# Patient Record
Sex: Female | Born: 1980 | Race: Black or African American | Hispanic: No | Marital: Single | Smoking: Never smoker
Health system: Southern US, Community
[De-identification: ages and names within clinical notes are randomized; demographics above are authoritative.]

## PROBLEM LIST (undated history)

## (undated) DIAGNOSIS — G43909 Migraine, unspecified, not intractable, without status migrainosus: Secondary | ICD-10-CM

---

## 2005-01-28 ENCOUNTER — Other Ambulatory Visit: Admission: RE | Admit: 2005-01-28 | Discharge: 2005-01-28 | Payer: Self-pay | Admitting: Gynecology

## 2006-02-03 ENCOUNTER — Other Ambulatory Visit: Admission: RE | Admit: 2006-02-03 | Discharge: 2006-02-03 | Payer: Self-pay | Admitting: Gynecology

## 2008-02-01 ENCOUNTER — Inpatient Hospital Stay (HOSPITAL_COMMUNITY): Admission: RE | Admit: 2008-02-01 | Discharge: 2008-02-03 | Payer: Self-pay | Admitting: Obstetrics and Gynecology

## 2008-07-14 ENCOUNTER — Emergency Department (HOSPITAL_COMMUNITY): Admission: EM | Admit: 2008-07-14 | Discharge: 2008-07-14 | Payer: Self-pay | Admitting: Family Medicine

## 2009-05-28 ENCOUNTER — Inpatient Hospital Stay (HOSPITAL_COMMUNITY): Admission: AD | Admit: 2009-05-28 | Discharge: 2009-05-28 | Payer: Self-pay | Admitting: Obstetrics and Gynecology

## 2009-12-17 ENCOUNTER — Inpatient Hospital Stay (HOSPITAL_COMMUNITY): Admission: RE | Admit: 2009-12-17 | Discharge: 2009-12-19 | Payer: Self-pay | Admitting: Obstetrics and Gynecology
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
## 2010-07-26 LAB — CBC
HCT: 26.3 % — ABNORMAL LOW (ref 36.0–46.0)
HCT: 31.5 % — ABNORMAL LOW (ref 36.0–46.0)
Hemoglobin: 10.3 g/dL — ABNORMAL LOW (ref 12.0–15.0)
Hemoglobin: 8.6 g/dL — ABNORMAL LOW (ref 12.0–15.0)
MCHC: 32.8 g/dL (ref 30.0–36.0)
MCV: 80.9 fL (ref 78.0–100.0)
RBC: 3.29 MIL/uL — ABNORMAL LOW (ref 3.87–5.11)
RDW: 14.1 % (ref 11.5–15.5)
WBC: 9.6 10*3/uL (ref 4.0–10.5)

## 2010-07-26 LAB — CCBB MATERNAL DONOR DRAW

## 2010-07-26 LAB — SURGICAL PCR SCREEN
MRSA, PCR: NEGATIVE
Staphylococcus aureus: POSITIVE — AB

## 2010-09-24 NOTE — H&P (Signed)
NAME:  JAMES, LAFALCE NO.:  [id number]   MEDICAL RECORD NO.:  [id number]          PATIENT TYPE:  INP   LOCATION:  NA                            FACILITY:  [facility]   PHYSICIAN:  Huel Cote, M.D. DATE OF BIRTH:  [birth year]   DATE OF ADMISSION:  DATE OF DISCHARGE:                              HISTORY & PHYSICAL   PLAN:  She is scheduled for surgery at the [facility] at  7:30 a.m. on February 01, 2008.   HISTORY:  The patient is a 30 year old G2, P 0-0-1-0 who is coming in at  39+ weeks' gestation for a scheduled primary low transverse C. section  due to a finding of a breech presentation.  The patient declined any  trial aversion and elected to undergo cesarean section after the risks  and benefits were discussed with her in detail.  Her prenatal care was  otherwise uneventful.   LABORATORY DATA:  Prenatal labs are as follows:  O+ antibody negative,  RPR nonreactive, sickle normal, rubella immune, hepatitis B surface  antigen negative, HIV negative, GC negative, chlamydia negative.  First  trimester screen normal and group B strep negative.   PAST OBSTETRICAL HISTORY:  Significant for elective abortion in 2004.   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  Most recent Pap  smear in March 2009 was normal.   PAST MEDICAL HISTORY:  None except migraines.   PAST SURGICAL HISTORY:  None.   ALLERGIES:  NONE.   CURRENT MEDICATIONS:  Include prenatal vitamins only.   PHYSICAL EXAMINATION:  VITAL SIGNS:  The patient's weight is 226 pounds,  blood pressure is 110/60.  CARDIAC:  Regular rate and rhythm.  LUNGS:  Clear.  ABDOMEN:  Soft, gravid and nontender.  GU:  Cervix is 50 and fingertip.   DIAGNOSTICS:  Ultrasound confirmed breech presentation.   ASSESSMENT:  The patient was counseled of the risks and benefits of  surgery including bleeding, infection and possible damage to bowel and  bladder.  She was also given the option of a  trial aversion.  However,  decided against this and would like to proceed with cesarean section at  this time.  After careful consideration, she was scheduled for cesarean  section on February 01, 2008 at 7:30 a.m. and will have her preop  appointment there prior to that.      Huel Cote, M.D.  Electronically Signed     KR/MEDQ  D:  01/31/2008  T:  01/31/2008  Job:  [id number]

## 2010-09-24 NOTE — Op Note (Signed)
Sarah Holden, Sarah Holden                ACCOUNT NO.:  [id number]   MEDICAL RECORD NO.:  [id number]          PATIENT TYPE:  INP   LOCATION:  9133                          FACILITY:  [facility]   PHYSICIAN:  Huel Cote, M.D. DATE OF BIRTH:  11-Jun-1980   DATE OF PROCEDURE:  02/01/2008  DATE OF DISCHARGE:                               OPERATIVE REPORT   PREOPERATIVE DIAGNOSES:  1. Term pregnancy at 39 plus weeks delivery.  2. Breech presentation.   POSTOPERATIVE DIAGNOSIS:  1. Term pregnancy at 39 plus weeks delivery.  2. Breech presentation.   PROCEDURE:  Primary low-transverse cesarean section by two-layer closure  of uterus.   SURGEON:  Huel Cote, MD   ASSISTANT:  Sherron Monday, MD   ANESTHESIA:  Spinal.   FINDINGS:  A vigorous female infant in frank breech presentation, Apgar  were 8 and 9, weight was 9 pounds.  Even uterus was normal, except for  small serosal fibroid at the top of the fundus approximately 2 cm.  Tubes and ovaries appeared normal.   SPECIMEN:  Placenta was sent to L&D after cord blood abstraction.   ESTIMATED BLOOD LOSS:  800 mL.   URINE OUTPUT:  Approximately; 200 mL, clear urine.   IV FLUIDS:  1200 mL LR.   PROCEDURE IN DETAIL:  The patient was taken to the operating room where  spinal anesthesia was obtained without difficulty.  She was then prepped  and draped in normal sterile fashion in the dorsal supine position with  a leftward tilt.  With a Foley catheter in place, a Pfannenstiel skin  incision was made and carried through to the underlying layer of fascia  by sharp dissection and Bovie cautery.  The fascia was then nicked in  the midline and the incision was extended laterally with Mayo scissors.  The inferior aspect was grasped with Kocher clamps, elevated and  dissected off the underlying rectus muscles.  The superior aspect was  likewise dissected off the rectus muscles.  The rectus muscles were then  separated in the midline and the  peritoneal cavity entered bluntly.  The  peritoneal incision was then extended both superiorly and inferiorly  with careful attention to avoid bowel and bladder.  The Alexis self-  retaining wound retractor was then placed within the incision and the  lower uterine segment exposed nicely.  The bladder flap was created with  Metzenbaum scissors and the uterus was then incised in a transverse  fashion.  The cavity itself was entered bluntly and extended bluntly.  The infant's bottom was then delivered atraumatically and the arms and  legs reduced carefully.  The head was then delivered in a flexed  position, and the infant bulb suctioned and handed off the awaiting  pediatricians.  The cord was clamped and cut, the infant bulb suctioned,  and handed off to the awaiting pediatricians.  The placenta was then  spontaneously expressed from the uterus and handed off for cord blood  donation.  The uterine cavity was then cleared of all clots and debris.  The uterine incision was then repaired in 2 layers, the  first a running-  locked layer of 0 chromic and the second an imbricating layer of same  suture.  The gutters were cleared of all clots and debris.  One  additional figure-of-eight suture was placed at the right angle where  there was a small amount of bleeding.  There was no active bleeding  noted at this point and all appeared hemostatic.  Therefore, all  instruments and sponges were removed from the abdomen.  The rectus  muscles were inspected in subfascial planes and the areas of bleeding  were treated with Bovie cautery.  The peritoneum was then closed in  several interrupted mattress sutures of 0 Vicryl and the fascia was then  closed with 0 Vicryl in a running fashion and the skin was closed with  staples.  Sponge, lap, and needle counts were correct x2 and the patient  was taken to the recovery room in stable condition.      Huel Cote, M.D.  Electronically Signed      KR/MEDQ  D:  02/01/2008  T:  02/01/2008  Job:  [id number]

## 2010-09-27 NOTE — Discharge Summary (Signed)
Sarah Holden, Sarah Holden                ACCOUNT NO.:  [id number]   MEDICAL RECORD NO.:  [id number]          PATIENT TYPE:  INP   LOCATION:  9133                          [facility]ACILITY:  [facility]   PHYSICIAN:  Huel Cote, M.D. DATE OF BIRTH:  1981/02/02   DATE OF ADMISSION:  02/01/2008  DATE OF DISCHARGE:  02/03/2008                               DISCHARGE SUMMARY   DISCHARGE DIAGNOSES:  1. Term pregnancy at 39 plus weeks.  2. Breech presentation.  3. Status post primary low-transverse cesarean section.   DISCHARGE MEDICATIONS:  1. Motrin 600 mg p.o. every 6 hours.  2. Percocet 1-2 tablets p.o. every 4 hours p.r.n.   DISCHARGE FOLLOWUP:  The patient is to follow up in the office in the  next 1-2 days for staple removal and in 2 weeks for incision check.   HOSPITAL COURSE:  The patient is a 30 year old G2, P 0-0-1-0, who came  in at 29 plus weeks for a scheduled primary low-transverse C-section due  to a finding of breech presentation.  She declined any trial of external  version and elected to proceed with a C-section instead.  Please see her  previously dictated H&P for the details of that.  On February 01, 2008,  she underwent a primary low-transverse cesarean section and was  delivered of a vigorous female infant in the frank breech presentation.  Apgars were 8 and 9, weight was 9 pounds, even the uterus was normal  with a small serosal fibroid about 2 cm noted, tubes and ovaries  appeared normal.  She was then admitted for a routine postoperative  care.  By postop day #2, she was ambulating well and tolerating regular  diet.  Her pain was minimal and she was really feeling ready for  discharge home.  Her discharge hemoglobin was 9.8 and she was discharged  with plans to follow up in the office in the day or two for her staple  removal.      Huel Cote, M.D.  Electronically Signed     KR/MEDQ  D:  03/19/2008  T:  03/19/2008  Job:  [id number]

## 2011-02-10 LAB — CBC
HCT: 29.8 — ABNORMAL LOW
HCT: 35.7 — ABNORMAL LOW
Hemoglobin: 11.6 — ABNORMAL LOW
MCV: 85.5
MCV: 85.6
Platelets: 81 — ABNORMAL LOW
RBC: 4.17
RDW: 13.6
WBC: 10
WBC: 10.9 — ABNORMAL HIGH

## 2011-02-10 LAB — ABO/RH: ABO/RH(D): O NEG

## 2011-02-10 LAB — TYPE AND SCREEN: Antibody Screen: NEGATIVE

## 2013-12-09 ENCOUNTER — Encounter (HOSPITAL_COMMUNITY): Payer: Self-pay | Admitting: Emergency Medicine

## 2013-12-09 DIAGNOSIS — J0120 Acute ethmoidal sinusitis, unspecified: Secondary | ICD-10-CM | POA: Insufficient documentation

## 2013-12-09 DIAGNOSIS — R51 Headache: Secondary | ICD-10-CM | POA: Insufficient documentation

## 2013-12-09 DIAGNOSIS — H8110 Benign paroxysmal vertigo, unspecified ear: Secondary | ICD-10-CM | POA: Insufficient documentation

## 2013-12-09 NOTE — ED Notes (Signed)
Pt. reports headache / dizziness onset this evening worse when lying down , vomitted x1 , denies fever or chills.

## 2013-12-10 ENCOUNTER — Emergency Department (HOSPITAL_COMMUNITY): Payer: BC Managed Care – PPO

## 2013-12-10 ENCOUNTER — Emergency Department (HOSPITAL_COMMUNITY)
Admission: EM | Admit: 2013-12-10 | Discharge: 2013-12-10 | Disposition: A | Discharge status: Home / Self Care | Payer: BC Managed Care – PPO | Attending: Emergency Medicine | Admitting: Emergency Medicine

## 2013-12-10 DIAGNOSIS — H8110 Benign paroxysmal vertigo, unspecified ear: Secondary | ICD-10-CM

## 2013-12-10 DIAGNOSIS — R519 Headache, unspecified: Secondary | ICD-10-CM

## 2013-12-10 DIAGNOSIS — R51 Headache: Secondary | ICD-10-CM

## 2013-12-10 DIAGNOSIS — J322 Chronic ethmoidal sinusitis: Secondary | ICD-10-CM

## 2013-12-10 HISTORY — DX: Migraine, unspecified, not intractable, without status migrainosus: G43.909

## 2013-12-10 MED ORDER — AMOXICILLIN-POT CLAVULANATE 875-125 MG PO TABS: 1.0000 | ORAL_TABLET | Freq: Two times a day (BID) | ORAL | Status: AC

## 2013-12-10 MED ORDER — MECLIZINE HCL 25 MG PO TABS: 25.0000 mg | ORAL_TABLET | Freq: Three times a day (TID) | ORAL | Status: AC | PRN

## 2013-12-10 MED ORDER — ONDANSETRON 4 MG PO TBDP
8.0000 mg | ORAL_TABLET | Freq: Once | ORAL | Status: AC
  Administered 2013-12-10: 8 mg via ORAL
  Filled 2013-12-10: qty 2

## 2013-12-10 MED ORDER — CETIRIZINE-PSEUDOEPHEDRINE ER 5-120 MG PO TB12: 1.0000 | ORAL_TABLET | Freq: Two times a day (BID) | ORAL | Status: AC

## 2013-12-10 MED ORDER — FLUTICASONE PROPIONATE 50 MCG/ACT NA SUSP: 2.0000 | Freq: Every day | NASAL | Status: AC

## 2013-12-10 MED ORDER — NAPROXEN 250 MG PO TABS
500.0000 mg | ORAL_TABLET | Freq: Once | ORAL | Status: AC
  Administered 2013-12-10: 500 mg via ORAL
  Filled 2013-12-10: qty 2

## 2013-12-10 MED ORDER — GUAIFENESIN ER 600 MG PO TB12: 600.0000 mg | ORAL_TABLET | Freq: Two times a day (BID) | ORAL | Status: AC

## 2013-12-10 NOTE — Discharge Instructions (Signed)
You were seen and evaluated for your headache and dizziness symptoms. Your CAT scan did not show any signs for a concerning or emergent cause for your symptoms. There were signs of a sinus infection. Please use the medications prescribed to help with your symptoms and follow up with your doctor for continued evaluation and treatment.    Benign Positional Vertigo Vertigo means you feel like you or your surroundings are moving when they are not. Benign positional vertigo is the most common form of vertigo. Benign means that the cause of your condition is not serious. Benign positional vertigo is more common in older adults. CAUSES  Benign positional vertigo is the result of an upset in the labyrinth system. This is an area in the middle ear that helps control your balance. This may be caused by a viral infection, head injury, or repetitive motion. However, often no specific cause is found. SYMPTOMS  Symptoms of benign positional vertigo occur when you move your head or eyes in different directions. Some of the symptoms may include:  Loss of balance and falls.  Vomiting.  Blurred vision.  Dizziness.  Nausea.  Involuntary eye movements (nystagmus). DIAGNOSIS  Benign positional vertigo is usually diagnosed by physical exam. If the specific cause of your benign positional vertigo is unknown, your caregiver may perform imaging tests, such as magnetic resonance imaging (MRI) or computed tomography (CT). TREATMENT  Your caregiver may recommend movements or procedures to correct the benign positional vertigo. Medicines such as meclizine, benzodiazepines, and medicines for nausea may be used to treat your symptoms. In rare cases, if your symptoms are caused by certain conditions that affect the inner ear, you may need surgery. HOME CARE INSTRUCTIONS   Follow your caregiver's instructions.  Move slowly. Do not make sudden body or head movements.  Avoid driving.  Avoid operating heavy  machinery.  Avoid performing any tasks that would be dangerous to you or others during a vertigo episode.  Drink enough fluids to keep your urine clear or pale yellow. SEEK IMMEDIATE MEDICAL CARE IF:   You develop problems with walking, weakness, numbness, or using your arms, hands, or legs.  You have difficulty speaking.  You develop severe headaches.  Your nausea or vomiting continues or gets worse.  You develop visual changes.  Your family or friends notice any behavioral changes.  Your condition gets worse.  You have a fever.  You develop a stiff neck or sensitivity to light. MAKE SURE YOU:   Understand these instructions.  Will watch your condition.  Will get help right away if you are not doing well or get worse. Document Released: 02/03/2006 Document Revised: 07/21/2011 Document Reviewed: 01/16/2011 [facility] Patient Information 2015 [place], [place]. This information is not intended to replace advice given to you by your health care provider. Make sure you discuss any questions you have with your health care provider.    Sinus Headache A sinus headache happens when your sinuses become clogged or puffy (swollen). Sinus headaches can be mild or severe. HOME CARE  Take your medicines (antibiotics) as told. Finish them even if you start to feel better.  Only take medicine as told by your doctor.  Use a nose spray if you feel stuffed up (congested). GET HELP RIGHT AWAY IF:  You have a fever.  You have trouble seeing.  You suddenly have pain in your face or head.  You start to twitch or shake (seizure).  You are confused.  You get headaches more than once a week.  Light or sound bothers you.  You feel sick to your stomach (nauseous) or throw up (vomit).  Your headaches do not get better with treatment. MAKE SURE YOU:  Understand these instructions.  Will watch your condition.  Will get help right away if you are not doing well or get  worse. Document Released: 08/28/2010 Document Revised: 07/21/2011 Document Reviewed: 08/28/2010 [facility] Patient Information 2015 [place], [place]. This information is not intended to replace advice given to you by your health care provider. Make sure you discuss any questions you have with your health care provider.

## 2013-12-10 NOTE — ED Provider Notes (Signed)
CSN: [id number]     Arrival date & time 12/09/13  2249 History   First MD Initiated Contact with Patient 12/10/13 0004     Chief Complaint  Patient presents with  . Headache   HPI  History provided by the patient. Patient is a 33 year old female with history of migraine headaches who presents with concerns for acute headache with dizziness and nausea vomiting symptoms. Patient reports that she was sitting with family at dinner when she suddenly had a rapid onset of headache and dizziness. The headache was not as severe as previous migraines and does not feel like a migraine to her. Dizziness was described as feeling as if she was off balance and spinning. She tried to go to her bedroom and laid down but this made her dizziness worse and cause worse nausea with episode of vomiting. She has been trying to rest since that time but has not had any improvement of her headache and continues to have brief episodes of dizziness. The dizziness occurs randomly but does seem to be associated with some movements. It only lasts a few seconds to a minute at the most and improves with rest. Patient does report recent symptoms of cough, congestion and sinus pressure. There has not been any fever, chills or sweats. No neck pain or stiffness. No hemoptysis, pleuritic chest pain or swelling in the extremities. No weakness or numbness in the extremities. No confusion or speech change.    Past Medical History  Diagnosis Date  . Migraine headache    Past Surgical History  Procedure Laterality Date  . Cesarean section     No family history on file. History  Substance Use Topics  . Smoking status: Never Smoker   . Smokeless tobacco: Not on file  . Alcohol Use: No   OB History   Grav Para Term Preterm Abortions TAB SAB Ect Mult Living                 Review of Systems  Constitutional: Negative for fever, chills, diaphoresis and fatigue.  HENT: Positive for congestion, rhinorrhea and sinus pressure. Negative  for sore throat.   Respiratory: Positive for cough. Negative for shortness of breath.   Cardiovascular: Negative for chest pain.  Gastrointestinal: Negative for nausea and vomiting.  Neurological: Positive for dizziness and headaches. Negative for syncope and speech difficulty.  Psychiatric/Behavioral: Negative for confusion.  All other systems reviewed and are negative.     Allergies  Review of patient's allergies indicates no known allergies.  Home Medications   Prior to Admission medications   Not on File   BP 144/87  Pulse 96  Temp(Src) 97.9 F (36.6 C) (Oral)  Resp 18  Wt 231 lb 5 oz (104.923 kg)  SpO2 100%  LMP 11/06/2013 Physical Exam  Nursing note and vitals reviewed. Constitutional: She is oriented to person, place, and time. She appears well-developed and well-nourished. No distress.  HENT:  Head: Normocephalic and atraumatic.  Right Ear: Tympanic membrane normal.  Left Ear: Tympanic membrane normal.  Mouth/Throat: Uvula is midline, oropharynx is clear and moist and mucous membranes are normal.  Eyes: Conjunctivae and EOM are normal. Pupils are equal, round, and reactive to light.  Neck: Normal range of motion. Neck supple.  No meningeal signs  Cardiovascular: Normal rate and regular rhythm.   No murmur heard. Pulmonary/Chest: Effort normal and breath sounds normal. No respiratory distress. She has no wheezes. She has no rales.  Abdominal: Soft. There is no tenderness. There is no  rigidity, no rebound, no guarding, no CVA tenderness and no tenderness at McBurney's point.  Musculoskeletal: Normal range of motion.  Neurological: She is alert and oriented to person, place, and time. She has normal strength. No cranial nerve deficit or sensory deficit. Gait normal.  No nystagmus. No reproducible symptoms with Dix-Hallpike maneuver.  Skin: Skin is warm and dry. No rash noted.  Psychiatric: She has a normal mood and affect. Her behavior is normal.    ED Course   Procedures   COORDINATION OF CARE:  Nursing notes reviewed. Vital signs reviewed. Initial pt interview and examination performed.   Filed Vitals:   12/09/13 2257  BP: 144/87  Pulse: 96  Temp: 97.9 F (36.6 C)  TempSrc: Oral  Resp: 18  Weight: 231 lb 5 oz (104.923 kg)  SpO2: 100%    12:35 AM-patient seen and evaluated. Patient appears well no acute distress. She has a normal nonfocal neuro exam. Headache is not as severe of pain as previous migraines. She was concerned however about the abruptness as well as the associated dizziness and nausea that she has had with this headache.    CT scan without any acute concerning emergent conditions. There are signs of ethmoid sinusitis. We'll plan to give antibiotic and symptomatic treatments. We'll also give symptomatic treatment for benign positional vertigo. Patient agrees with this plan.   Treatment plan initiated: Medications  ondansetron (ZOFRAN-ODT) disintegrating tablet 8 mg (8 mg Oral Given 12/10/13 0059)  naproxen (NAPROSYN) tablet 500 mg (500 mg Oral Given 12/10/13 0059)        Imaging Review Ct Head Wo Contrast  12/10/2013   CLINICAL DATA:  Migraine, frontal headache with dizziness. Nausea and vomiting.  EXAM: CT HEAD WITHOUT CONTRAST  TECHNIQUE: Contiguous axial images were obtained from the base of the skull through the vertex without intravenous contrast.  COMPARISON:  None.  FINDINGS: The ventricles and sulci are normal. No intraparenchymal hemorrhage, mass effect nor midline shift. No acute large vascular territory infarcts.  No abnormal extra-axial fluid collections. Basal cisterns are patent.  No skull fracture. The included ocular globes and orbital contents are non-suspicious. Trace ethmoid mucosal thickening without air-fluid levels. The sphenoid sinuses are well aerated.  IMPRESSION: No acute intracranial process ; normal noncontrast CT of the head.  Trace ethmoid sinusitis.   Electronically Signed   By: Awilda Metroourtnay   Bloomer   On: 12/10/2013 00:57     MDM   Final diagnoses:  Sinus headache  Ethmoid sinusitis, unspecified chronicity  Benign positional vertigo, unspecified laterality        Angus Sellereter S Demetrius Barrell, PA-C 12/10/13 2106

## 2013-12-11 NOTE — ED Provider Notes (Signed)
Medical screening examination/treatment/procedure(s) were performed by non-physician practitioner and as supervising physician I was immediately available for consultation/collaboration.   EKG Interpretation None        Lelynd Poer W Charlina Dwight, MD 12/11/13 0746 

## 2015-07-29 IMAGING — CT CT HEAD W/O CM
1 series · 16 of 30 positions shown, 20 images · non-contrast
Comparison: None.

CLINICAL DATA: Migraine, frontal headache with dizziness. Nausea
and vomiting.

EXAM:
CT HEAD WITHOUT CONTRAST
TECHNIQUE: Contiguous axial images were obtained from the base of the skull
through the vertex without intravenous contrast.

[Series 2: head 5.0 h30s · axial · 0.45mm/px · z∈[-85,+50]mm · 16 of 31 slices shown, 20 images]
[im 2/31  brain]
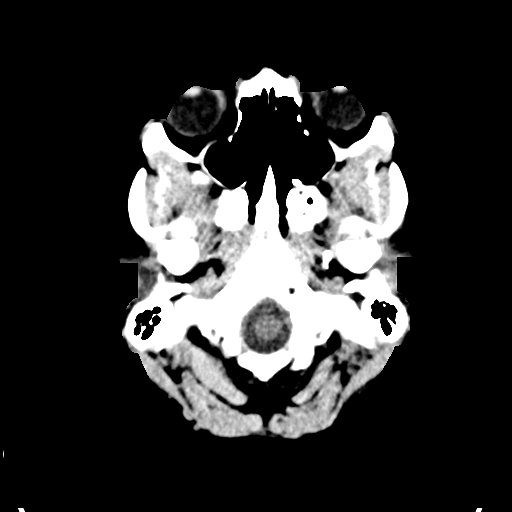
[im 2/31  bone]
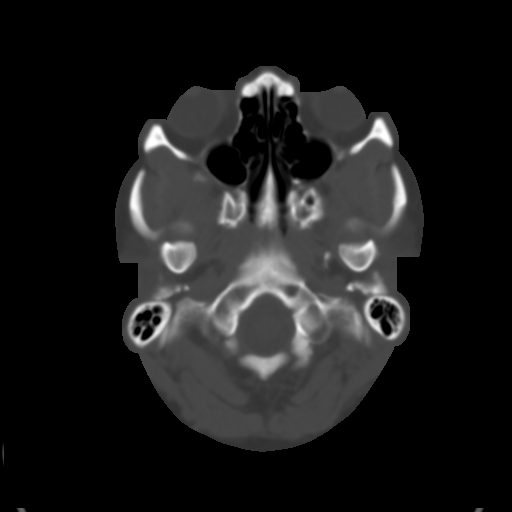
[im 4/31  brain]
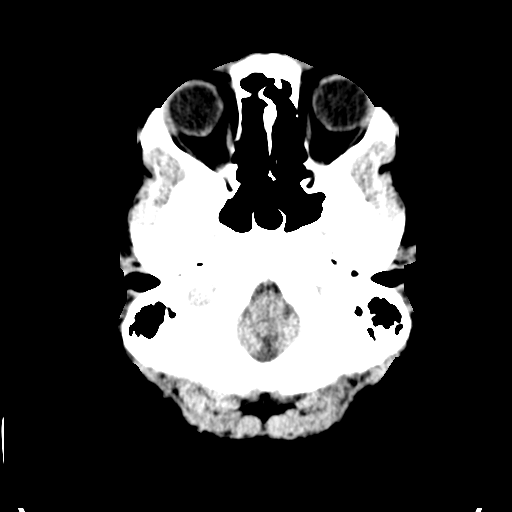
[im 6/31  brain]
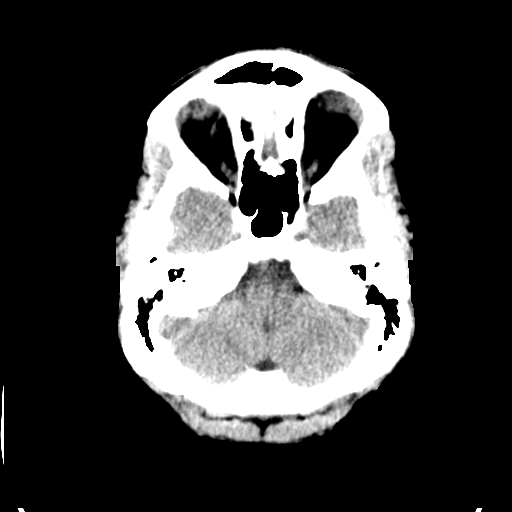
[im 8/31  brain]
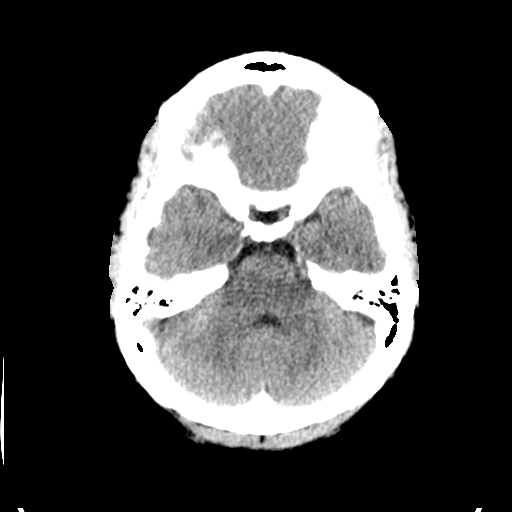
[im 9/31  brain]
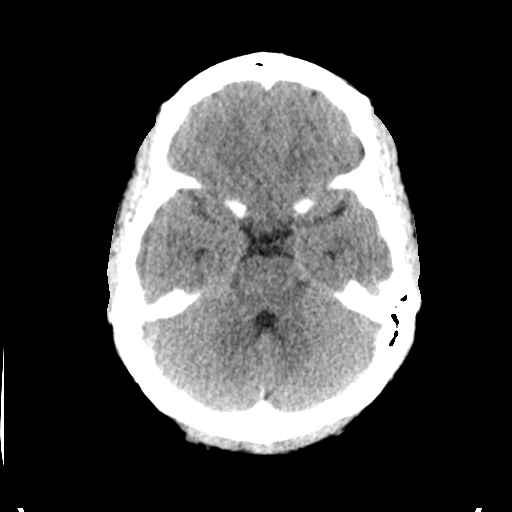
[im 9/31  bone]
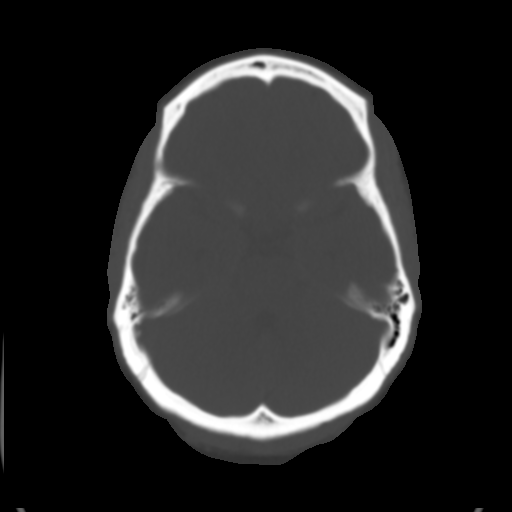
[im 11/31  brain]
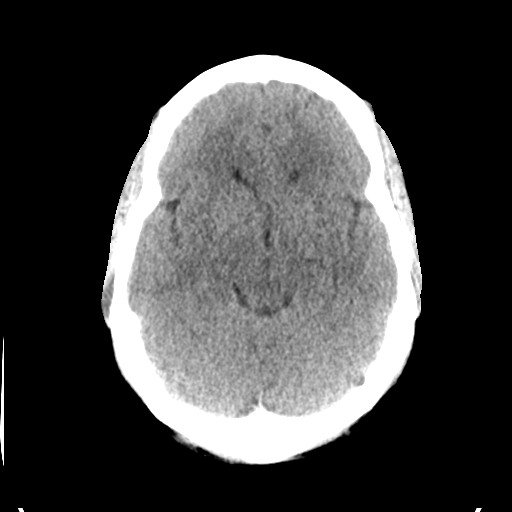
[im 13/31  brain]
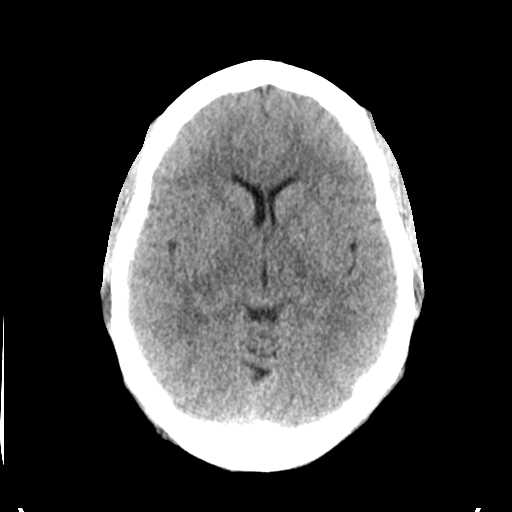
[im 15/31  brain]
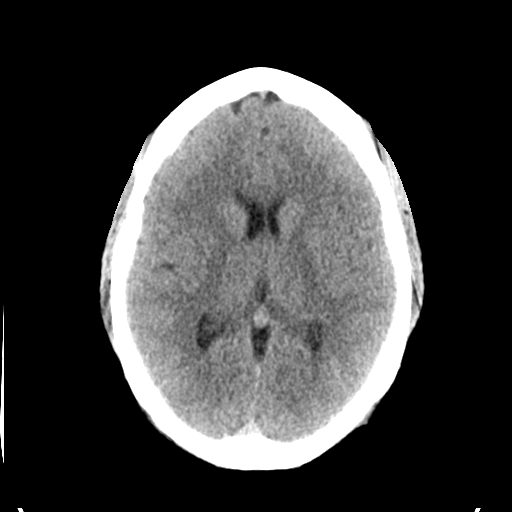
[im 16/31  brain]
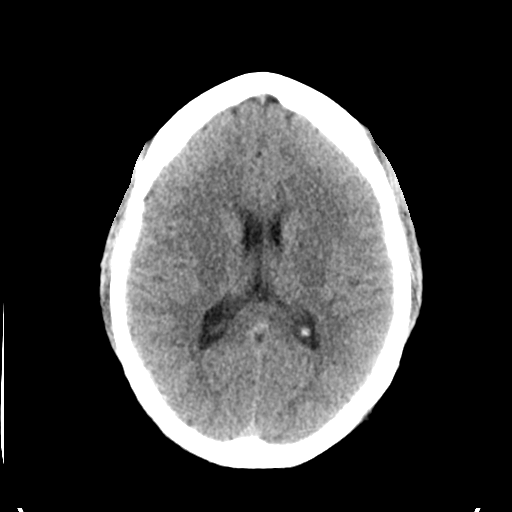
[im 16/31  bone]
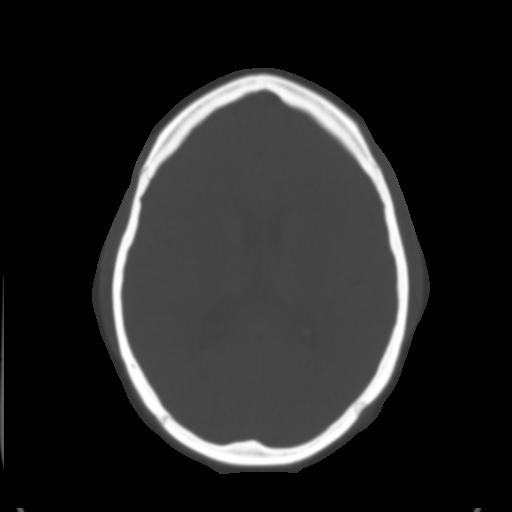
[im 18/31  brain]
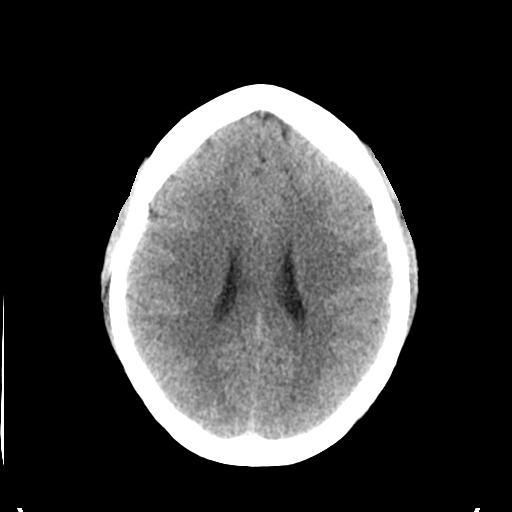
[im 20/31  brain]
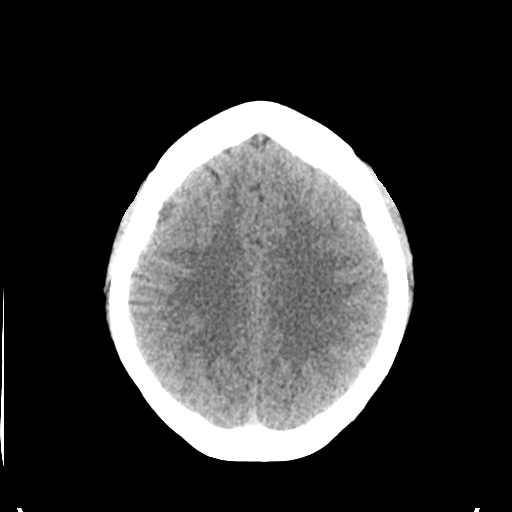
[im 22/31  brain]
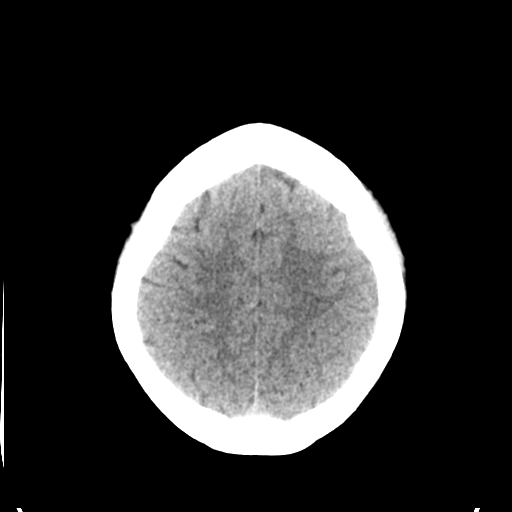
[im 23/31  brain]
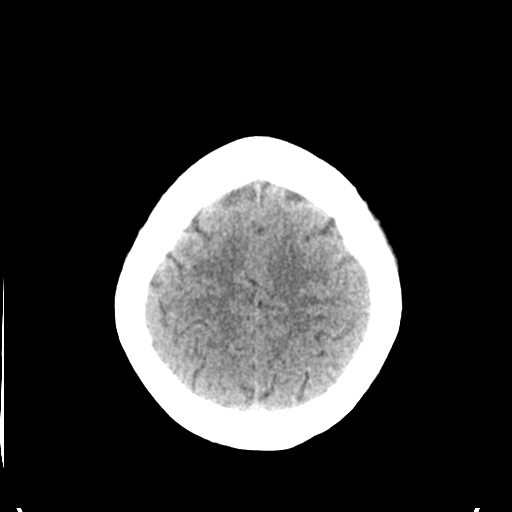
[im 23/31  bone]
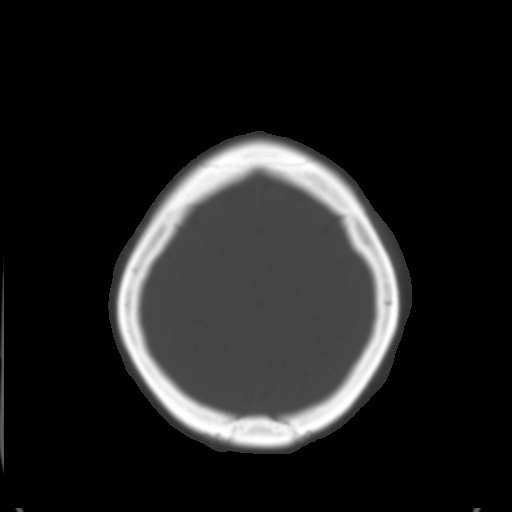
[im 25/31  brain]
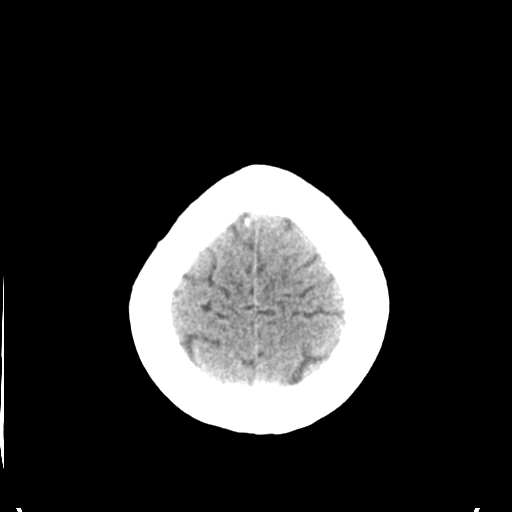
[im 27/31  brain]
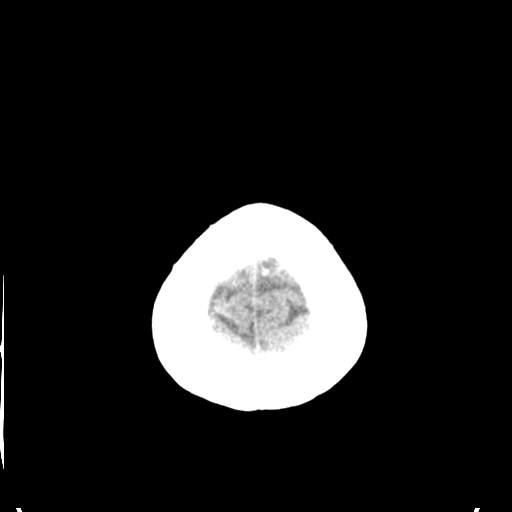
[im 29/31  brain]
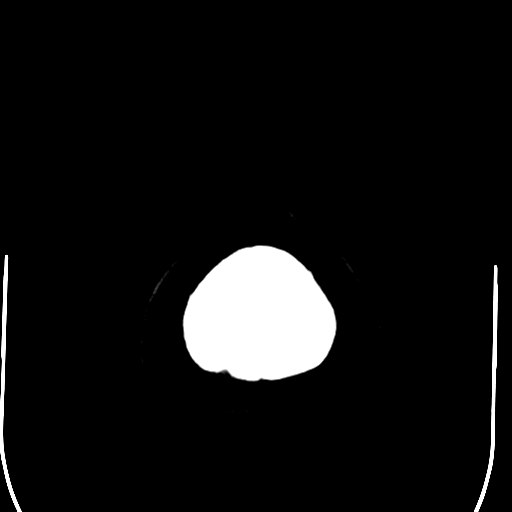

[16 of 30 positions shown; findings below may reference images not displayed]

FINDINGS: The ventricles and sulci are normal. No intraparenchymal hemorrhage,
mass effect nor midline shift. No acute large vascular territory
infarcts.

No abnormal extra-axial fluid collections. Basal cisterns are
patent.

No skull fracture. The included ocular globes and orbital contents
are non-suspicious. Trace ethmoid mucosal thickening without
air-fluid levels. The sphenoid sinuses are well aerated.
IMPRESSION: No acute intracranial process ; normal noncontrast CT of the head.

Trace ethmoid sinusitis.

  By: Levonia Faliya
